# Patient Record
Sex: Female | Born: 1975 | Race: White | Hispanic: No | Marital: Married | Smoking: Never smoker
Health system: Southern US, Community
[De-identification: ages and names within clinical notes are randomized; demographics above are authoritative.]

## PROBLEM LIST (undated history)

## (undated) HISTORY — PX: MYRINGOTOMY WITH TUBE PLACEMENT: SHX5663

## (undated) HISTORY — PX: CHOLECYSTECTOMY: SHX55

---

## 2009-08-23 ENCOUNTER — Encounter: Admission: RE | Admit: 2009-08-23 | Discharge: 2009-08-23 | Payer: Self-pay | Admitting: Family Medicine

## 2010-10-29 IMAGING — CR DG ANKLE COMPLETE 3+V*R*
3 series · 3 of 3 positions shown · non-contrast
Comparison: None.

CLINICAL DATA: Fell last week with pain laterally

RIGHT ANKLE - COMPLETE 3+ VIEW

[view not recorded (1 of 3)]
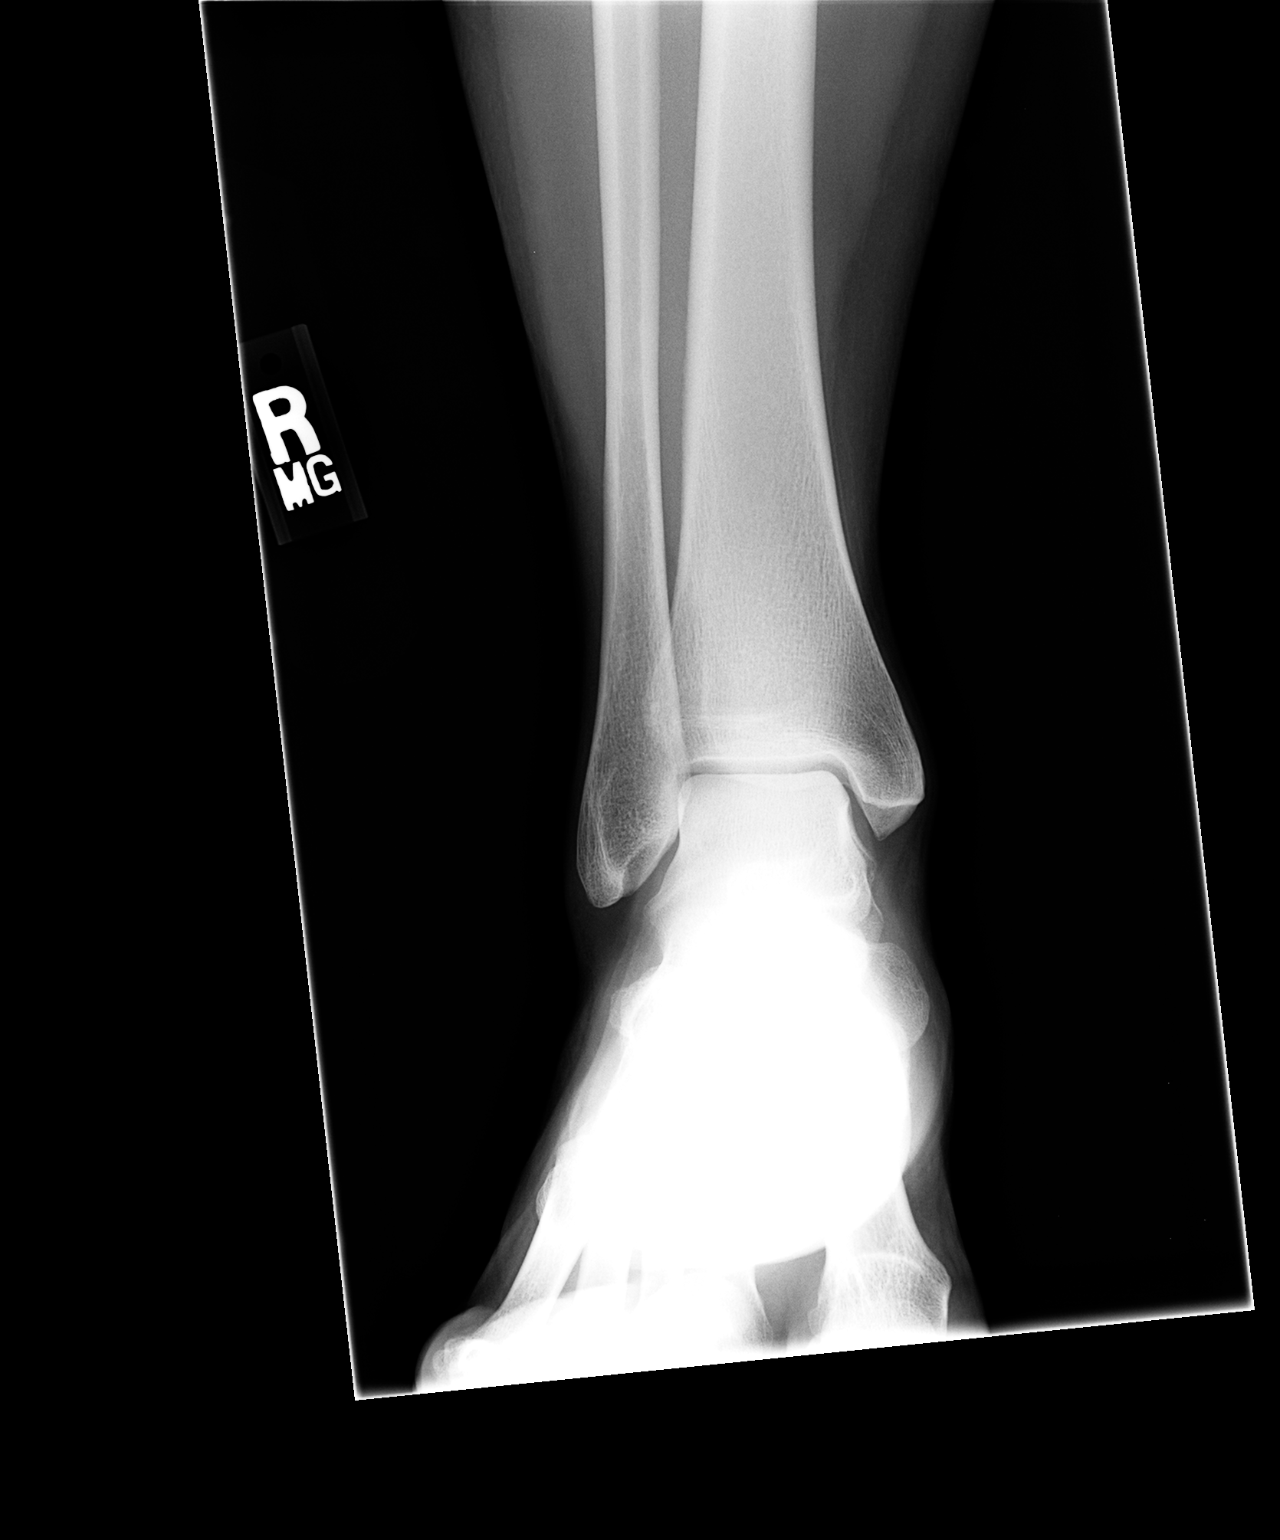

[view not recorded (2 of 3)]
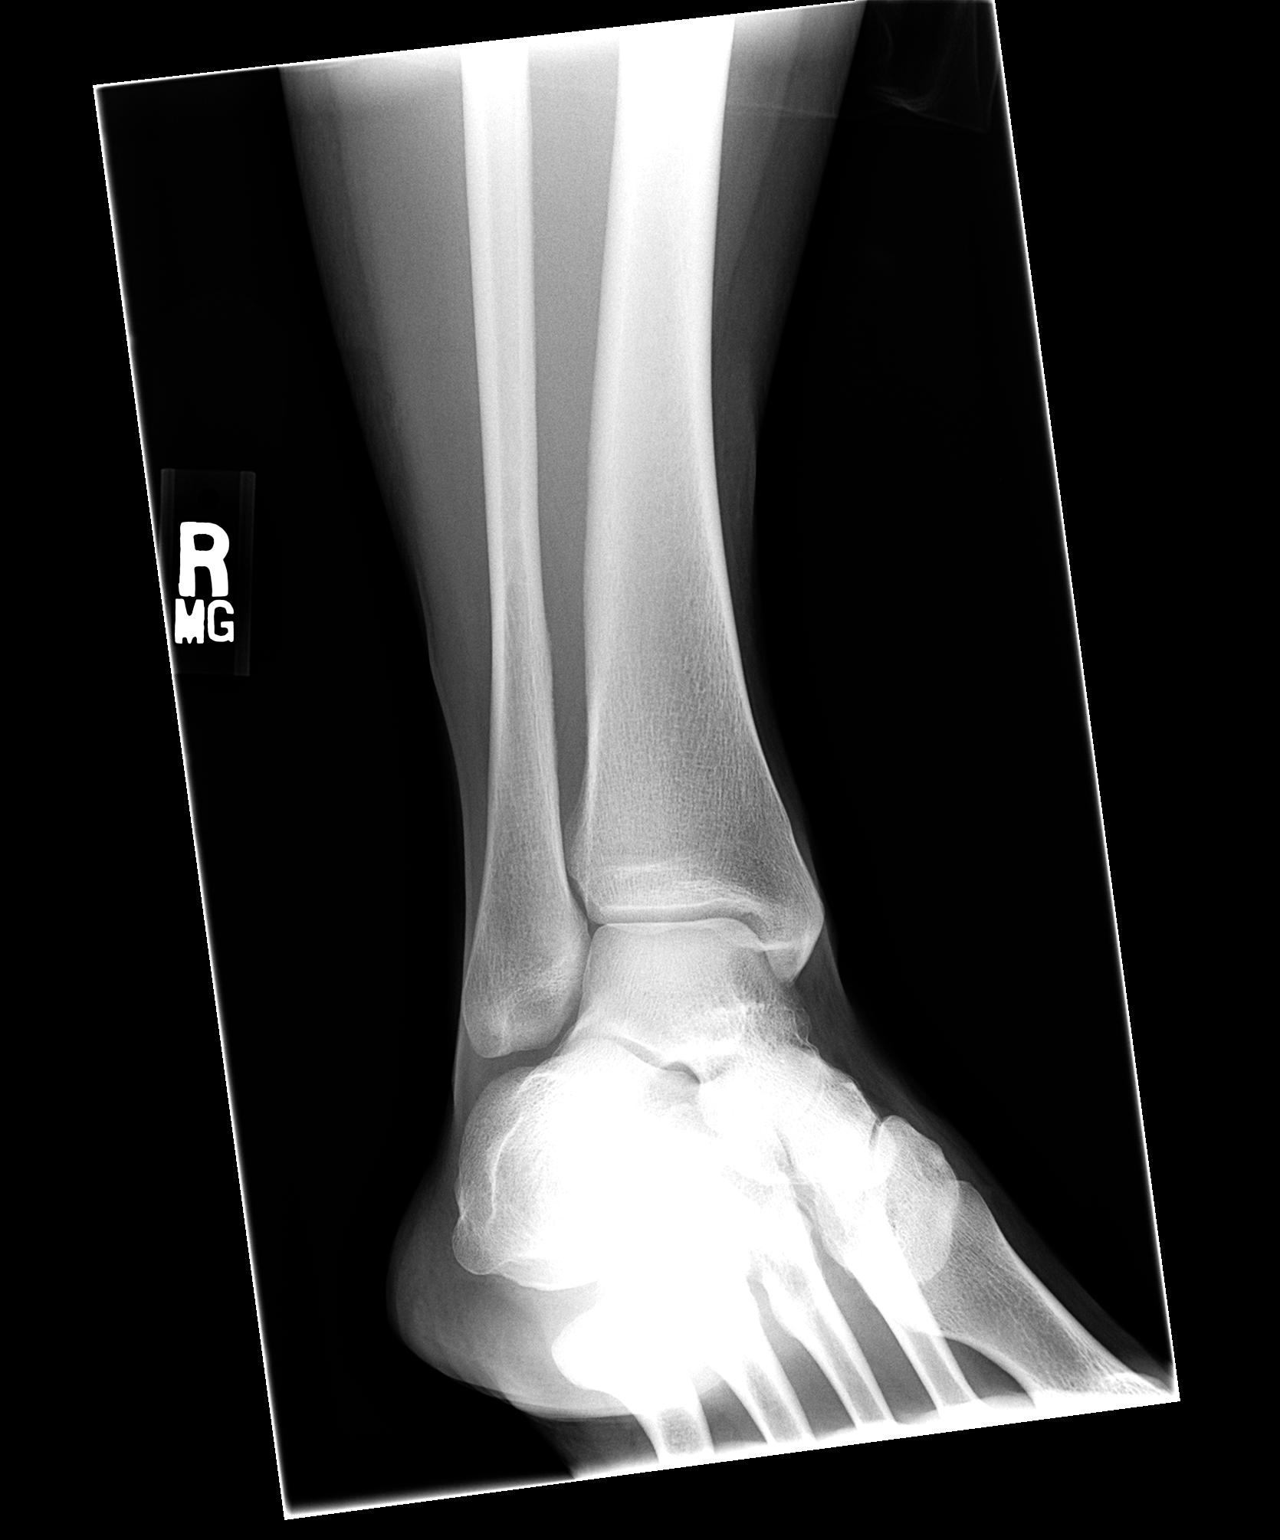

[view not recorded (3 of 3)]
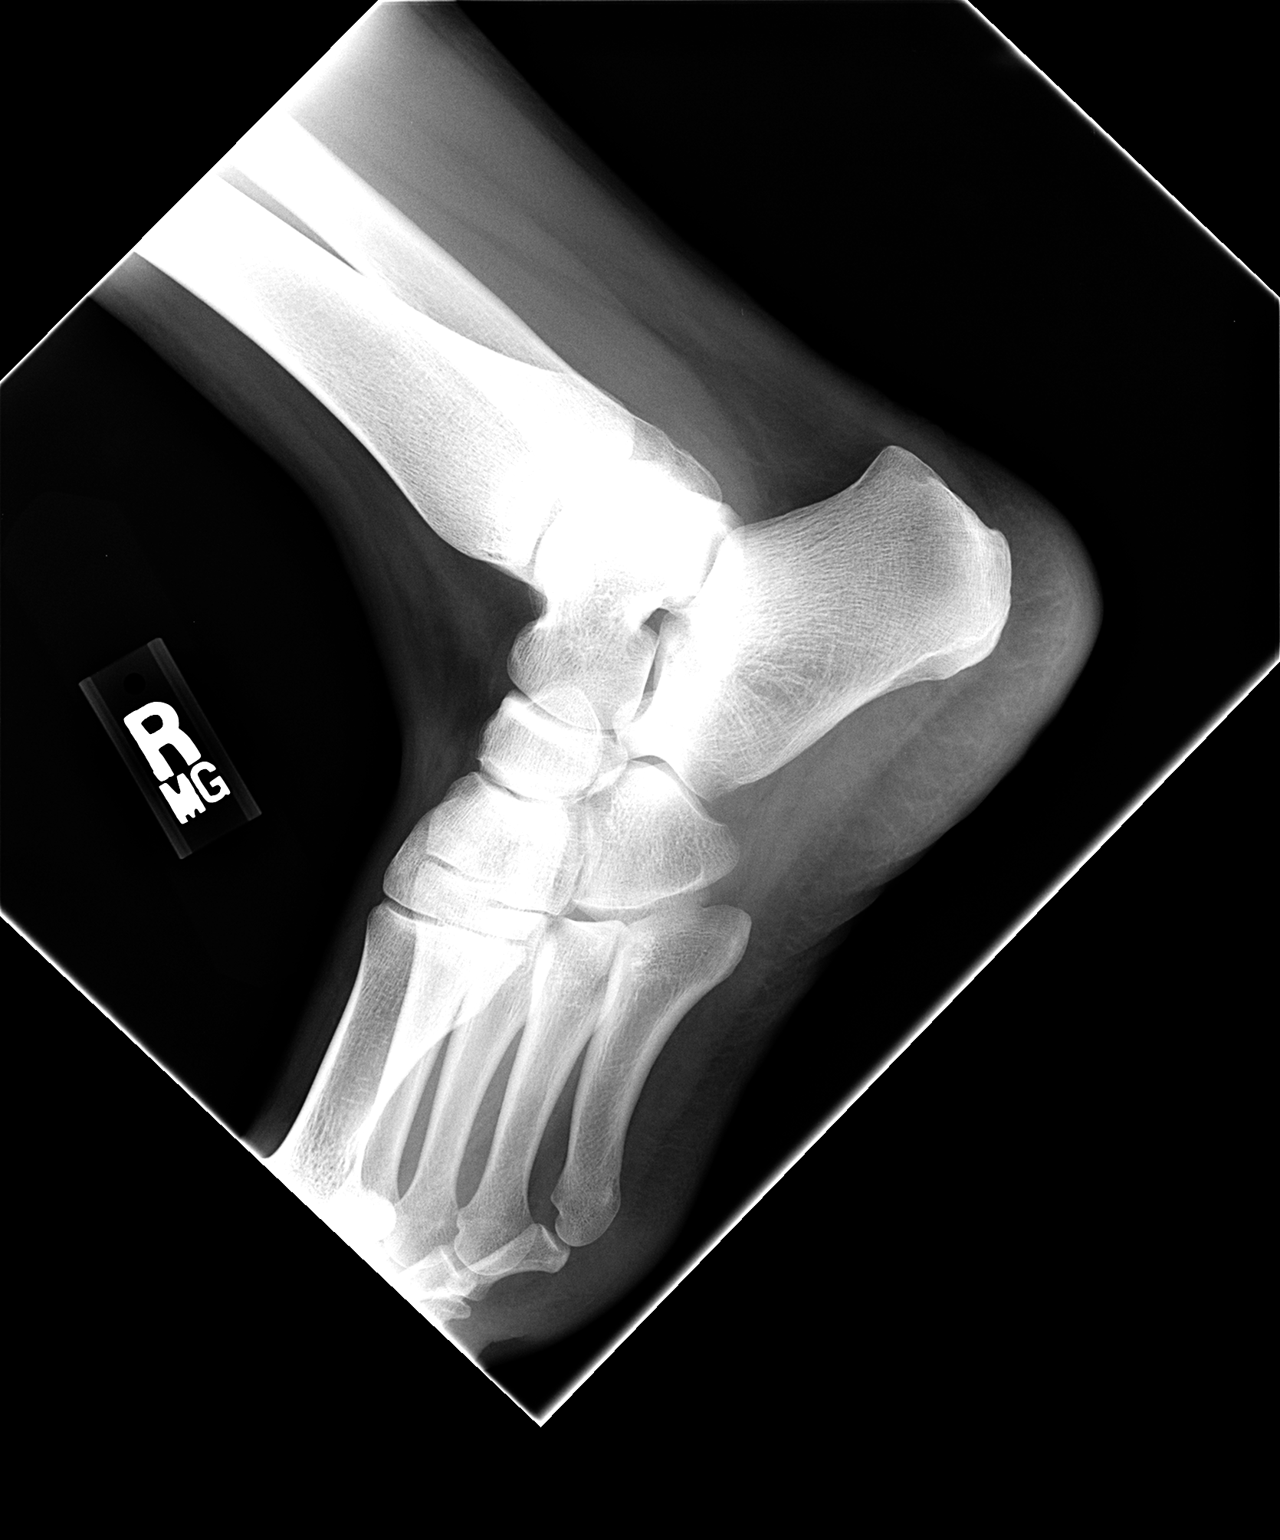

[3 of 3 positions shown; findings below may reference images not displayed]

FINDINGS: The ankle joint appears normal.  No acute fracture is
seen.  Alignment is normal.
IMPRESSION: Negative right ankle.

## 2014-11-09 ENCOUNTER — Emergency Department (HOSPITAL_COMMUNITY)
Admission: EM | Admit: 2014-11-09 | Discharge: 2014-11-09 | Disposition: A | Discharge status: Home / Self Care | Payer: BLUE CROSS/BLUE SHIELD | Attending: Emergency Medicine | Admitting: Emergency Medicine

## 2014-11-09 ENCOUNTER — Encounter (HOSPITAL_COMMUNITY): Payer: Self-pay | Admitting: Emergency Medicine

## 2014-11-09 ENCOUNTER — Emergency Department (HOSPITAL_COMMUNITY): Payer: BLUE CROSS/BLUE SHIELD

## 2014-11-09 DIAGNOSIS — Y9389 Activity, other specified: Secondary | ICD-10-CM | POA: Insufficient documentation

## 2014-11-09 DIAGNOSIS — Y998 Other external cause status: Secondary | ICD-10-CM | POA: Insufficient documentation

## 2014-11-09 DIAGNOSIS — Y9289 Other specified places as the place of occurrence of the external cause: Secondary | ICD-10-CM | POA: Insufficient documentation

## 2014-11-09 DIAGNOSIS — S92352A Displaced fracture of fifth metatarsal bone, left foot, initial encounter for closed fracture: Secondary | ICD-10-CM | POA: Diagnosis not present

## 2014-11-09 DIAGNOSIS — S92302A Fracture of unspecified metatarsal bone(s), left foot, initial encounter for closed fracture: Secondary | ICD-10-CM

## 2014-11-09 DIAGNOSIS — W1843XA Slipping, tripping and stumbling without falling due to stepping from one level to another, initial encounter: Secondary | ICD-10-CM | POA: Insufficient documentation

## 2014-11-09 DIAGNOSIS — S99922A Unspecified injury of left foot, initial encounter: Secondary | ICD-10-CM | POA: Diagnosis present

## 2014-11-09 MED ORDER — HYDROCODONE-ACETAMINOPHEN 5-325 MG PO TABS: 1.0000 | ORAL_TABLET | Freq: Four times a day (QID) | ORAL | Status: AC | PRN

## 2014-11-09 MED ORDER — NAPROXEN 500 MG PO TABS: 500.0000 mg | ORAL_TABLET | Freq: Two times a day (BID) | ORAL | Status: AC

## 2014-11-09 NOTE — ED Notes (Signed)
Pt stable, ambulatory, states understanding of discharge instructions 

## 2014-11-09 NOTE — ED Notes (Signed)
Ortho bedside

## 2014-11-09 NOTE — Progress Notes (Signed)
Orthopedic Tech Progress Note Patient Details:  Joanna Hoover 11/12/1975 [id number]  Ortho Devices Type of Ortho Device: Ace wrap, Crutches, Post (short leg) splint Ortho Device/Splint Location: LLE Ortho Device/Splint Interventions: Ordered, Application   Jennye Moccasin 11/09/2014, 10:46 PM

## 2014-11-09 NOTE — ED Provider Notes (Signed)
CSN: [id number]     Arrival date & time 11/09/14  2108 History  This chart was scribed for non-physician practitioner Jaynie Crumble, PA-C, working with Benjiman Core, MD, by Tanda Rockers, ED Scribe. This patient was seen in room TR10C/TR10C and the patient's care was started at 9:33 PM.  Chief Complaint  Patient presents with  . Foot Injury   The history is provided by the patient. No language interpreter was used.     HPI Comments: Joanna Hoover is a 39 y.o. female who presents to the Emergency Department complaining of sudden onset, constant, 8/10, left foot pain and swelling that occurred earlier tonight. Pt states that she missed a step, causing her to twist her ankle. Pt is able to ambulate but notes increased pain while bearing weight. She denies numbness, weakness, tingling, or any other associated symptoms.     History reviewed. No pertinent past medical history. Past Surgical History  Procedure Laterality Date  . Cholecystectomy    . Myringotomy with tube placement    . Cesarean section     No family history on file. Social History  Substance Use Topics  . Smoking status: Never Smoker   . Smokeless tobacco: None  . Alcohol Use: Yes   OB History    No data available     Review of Systems  Musculoskeletal: Positive for arthralgias (Left foot pain). Negative for gait problem.  Skin: Negative for wound.  Neurological: Negative for weakness and numbness.   Allergies  Review of patient's allergies indicates no known allergies.  Home Medications   Prior to Admission medications   Not on File   Triage Vitals: BP 162/102 mmHg  Pulse 87  Temp(Src) 98.9 F (37.2 C) (Oral)  Resp 16  Ht  (1.778 m)  Wt 190 lb (86.183 kg)  BMI 27.26 kg/m2  SpO2 98%  LMP 11/02/2014   Physical Exam  Constitutional: She is oriented to person, place, and time. She appears well-developed and well-nourished. No distress.  HENT:  Head: Normocephalic and atraumatic.   Eyes: Conjunctivae and EOM are normal.  Neck: Neck supple. No tracheal deviation present.  Cardiovascular: Normal rate.   Pulmonary/Chest: Effort normal. No respiratory distress.  Musculoskeletal: Normal range of motion.  Swelling noted to the dorsal lateral foot, mainly over the fifth and fourth metatarsals. Tender to palpation over fourth and fifth metatarsals. Normal toes, normal ankle with no tenderness to palpation of the bony joints. Full range of motion of the ankle and all toes. Dorsal pedal pulses intact.  Neurological: She is alert and oriented to person, place, and time.  Skin: Skin is warm and dry.  Psychiatric: She has a normal mood and affect. Her behavior is normal.  Nursing note and vitals reviewed.   ED Course  Procedures (including critical care time)  DIAGNOSTIC STUDIES: Oxygen Saturation is 98% on RA, normal by my interpretation.    COORDINATION OF CARE: 9:36 PM-Discussed treatment plan which includes DG L Foot with pt at bedside and pt agreed to plan.   Labs Review Labs Reviewed - No data to display  Imaging Review Dg Foot Complete Left  11/09/2014   CLINICAL DATA:  Left foot pain and swelling after injury. Missed a step and twisted left foot today, now with left foot pain. Initial encounter.  EXAM: LEFT FOOT - COMPLETE 3+ VIEW  COMPARISON:  None.  FINDINGS: Comminuted nondisplaced fracture base of the fifth metatarsal. There is no definite intra-articular extension. Associated soft tissue edema. No additional  fracture of the foot. The alignment is maintained.  IMPRESSION: Comminuted nondisplaced base of the fifth metatarsal fracture.   Electronically Signed   By: Rubye Oaks M.D.   On: 11/09/2014 22:20   I have personally reviewed and evaluated these images and lab results as part of my medical decision-making.   EKG Interpretation None      MDM   Final diagnoses:  Fracture of 5th metatarsal, left, closed, initial encounter   Pt with twisting  injury to the left foot. Reports of swelling, pain, unable to bear weight. Will get x-ray.  10:32 PM Comminuted nondisplaced fracture to the fifth metatarsal. We'll place in a splint, crutches, pain medications at home. Follow-up with orthopedic specialist for recheck next week. Nonweightbearing until follow-up.  Filed Vitals:   11/09/14 2118  BP: 162/102  Pulse: 87  Temp: 98.9 F (37.2 C)  TempSrc: Oral  Resp: 16  Height: 5\' 10"  (1.778 m)  Weight: 190 lb (86.183 kg)  SpO2: 98%     I personally performed the services described in this documentation, which was scribed in my presence. The recorded information has been reviewed and is accurate.    Jaynie Crumble, PA-C 11/09/14 2256  Benjiman Core, MD 11/10/14 [phone]

## 2014-11-09 NOTE — ED Notes (Signed)
Pt. missed her step and injured her left foot this evening , presents with pain and swelling .

## 2014-11-09 NOTE — Discharge Instructions (Signed)
Take naproxen for pain, take Norco for severe pain, keeping leg elevated, ice several times a day. Follow-up with orthopedic specialist next week.  Metatarsal Fracture, Undisplaced A metatarsal fracture is a break in the bone(s) of the foot. These are the bones of the foot that connect your toes to the bones of the ankle. DIAGNOSIS  The diagnoses of these fractures are usually made with X-rays. If there are problems in the forefoot and x-rays are normal a later bone scan will usually make the diagnosis.  TREATMENT AND HOME CARE INSTRUCTIONS  Treatment may or may not include a cast or walking shoe. When casts are needed the use is usually for short periods of time so as not to slow down healing with muscle wasting (atrophy).  Activities should be stopped until further advised by your caregiver.  Wear shoes with adequate shock absorbing capabilities and stiff soles.  Alternative exercise may be undertaken while waiting for healing. These may include bicycling and swimming, or as your caregiver suggests.  It is important to keep all follow-up visits or specialty referrals. The failure to keep these appointments could result in improper bone healing and chronic pain or disability.  Warning: Do not drive a car or operate a motor vehicle until your caregiver specifically tells you it is safe to do so. IF YOU DO NOT HAVE A CAST OR SPLINT:  You may walk on your injured foot as tolerated or advised.  Do not put any weight on your injured foot for as long as directed by your caregiver. Slowly increase the amount of time you walk on the foot as the pain allows or as advised.  Use crutches until you can bear weight without pain. A gradual increase in weight bearing may help.  Apply ice to the injury for 15-20 minutes each hour while awake for the first 2 days. Put the ice in a plastic bag and place a towel between the bag of ice and your skin.  Only take over-the-counter or prescription medicines  for pain, discomfort, or fever as directed by your caregiver. SEEK IMMEDIATE MEDICAL CARE IF:   Your cast gets damaged or breaks.  You have continued severe pain or more swelling than you did before the cast was put on, or the pain is not controlled with medications.  Your skin or nails below the injury turn blue or grey, or feel cold or numb.  There is a bad smell, or new stains or pus-like (purulent) drainage coming from the cast. MAKE SURE YOU:   Understand these instructions.  Will watch your condition.  Will get help right away if you are not doing well or get worse. Document Released: 11/02/2001 Document Revised: 05/05/2011 Document Reviewed: 09/24/2007 [facility] Patient Information 2015 [place], [place]. This information is not intended to replace advice given to you by your health care provider. Make sure you discuss any questions you have with your health care provider.

## 2016-01-15 IMAGING — CR DG FOOT COMPLETE 3+V*L*
3 series · 3 of 3 positions shown · non-contrast
Comparison: None.

CLINICAL DATA: Left foot pain and swelling after injury. Missed a
step and twisted left foot today, now with left foot pain. Initial
encounter.

EXAM:
LEFT FOOT - COMPLETE 3+ VIEW

[foot ap]
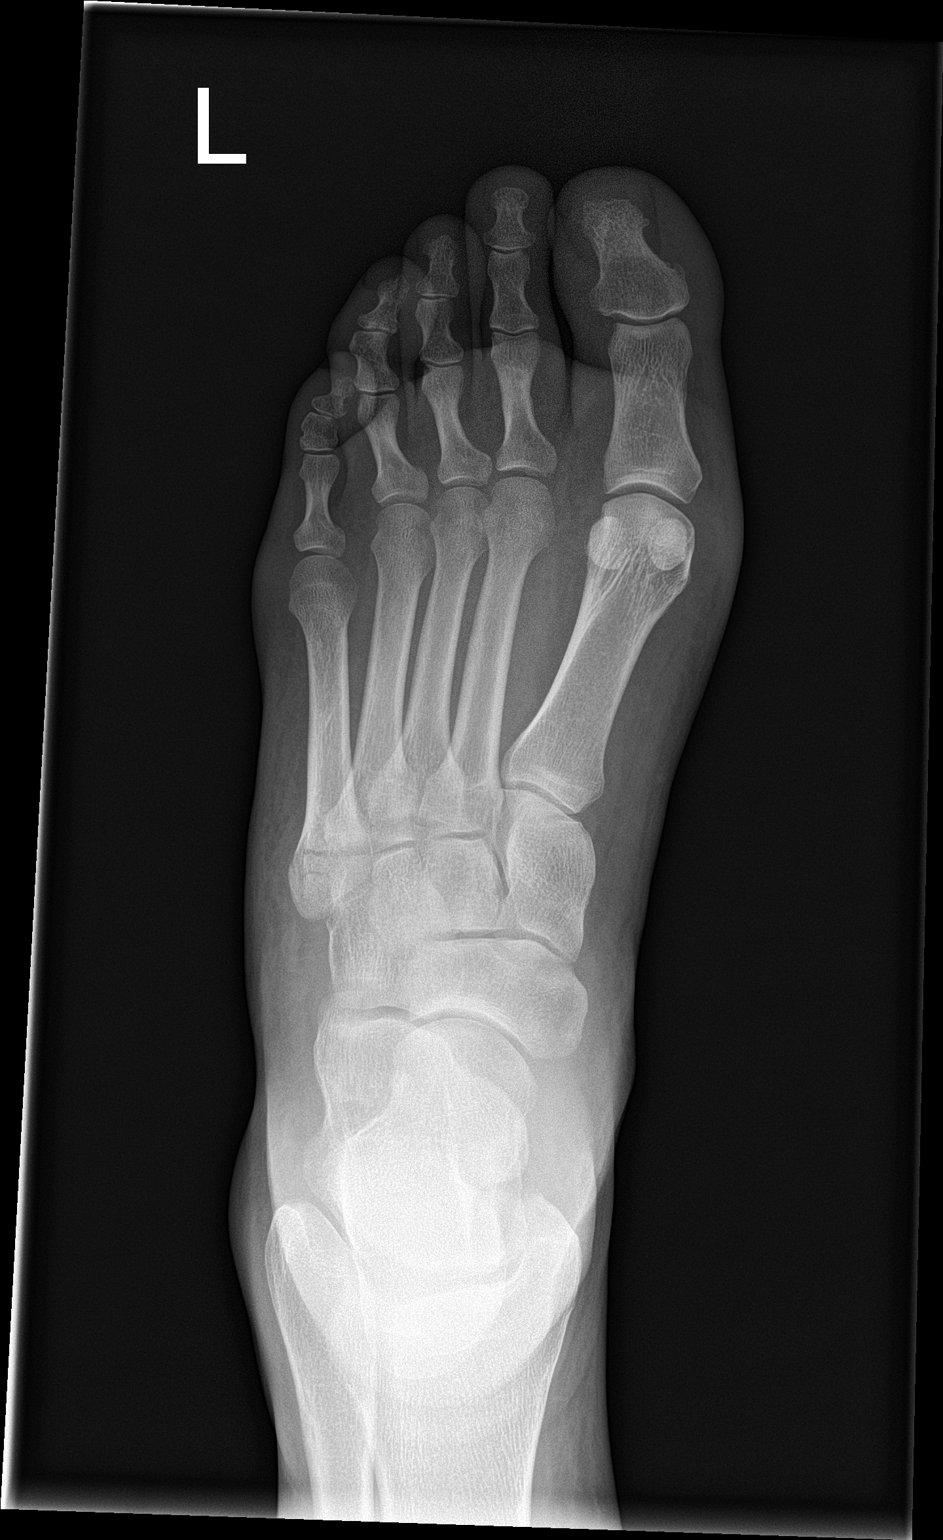

[foot obl]
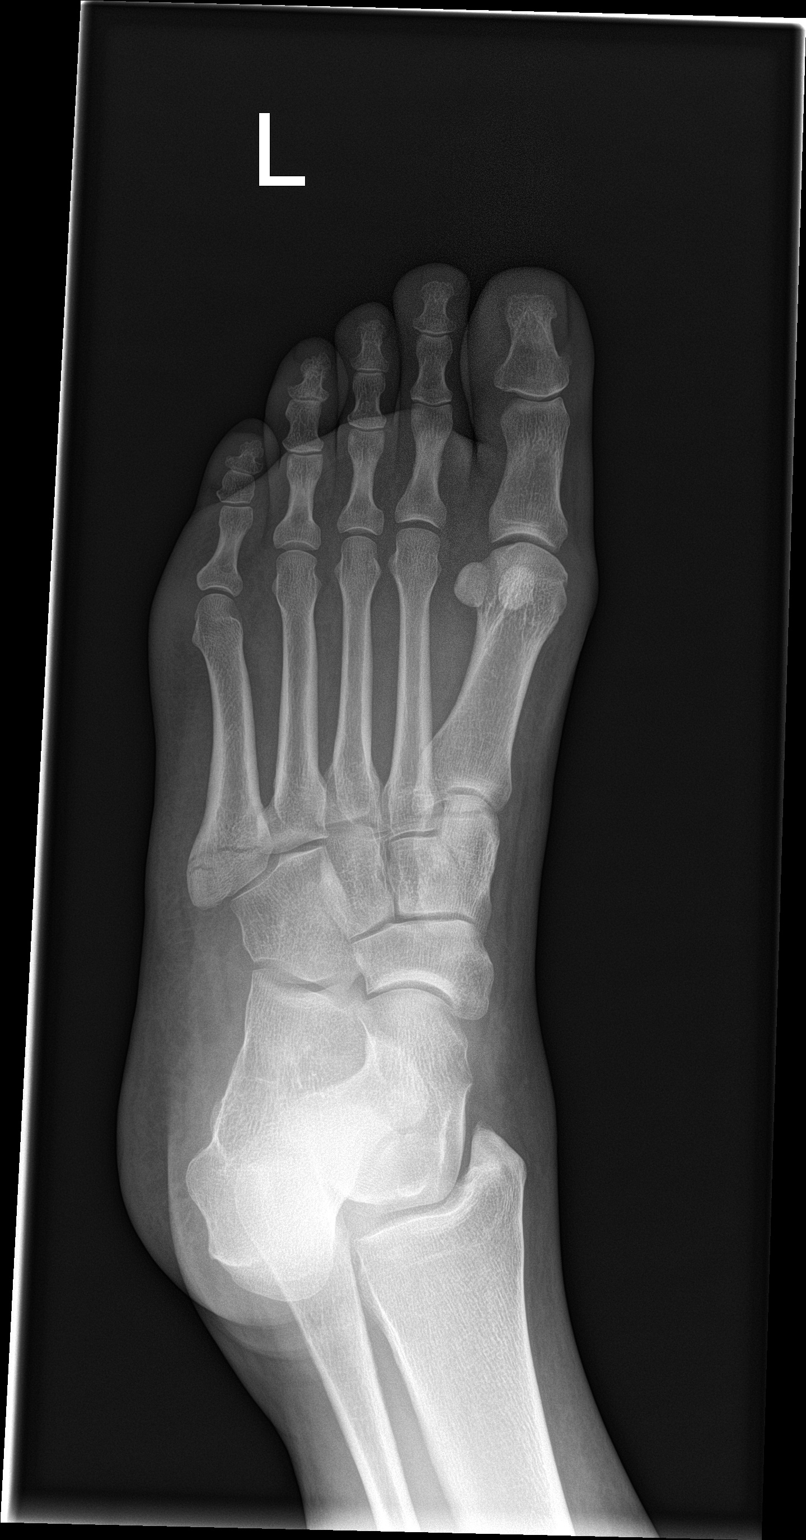

[foot lat]
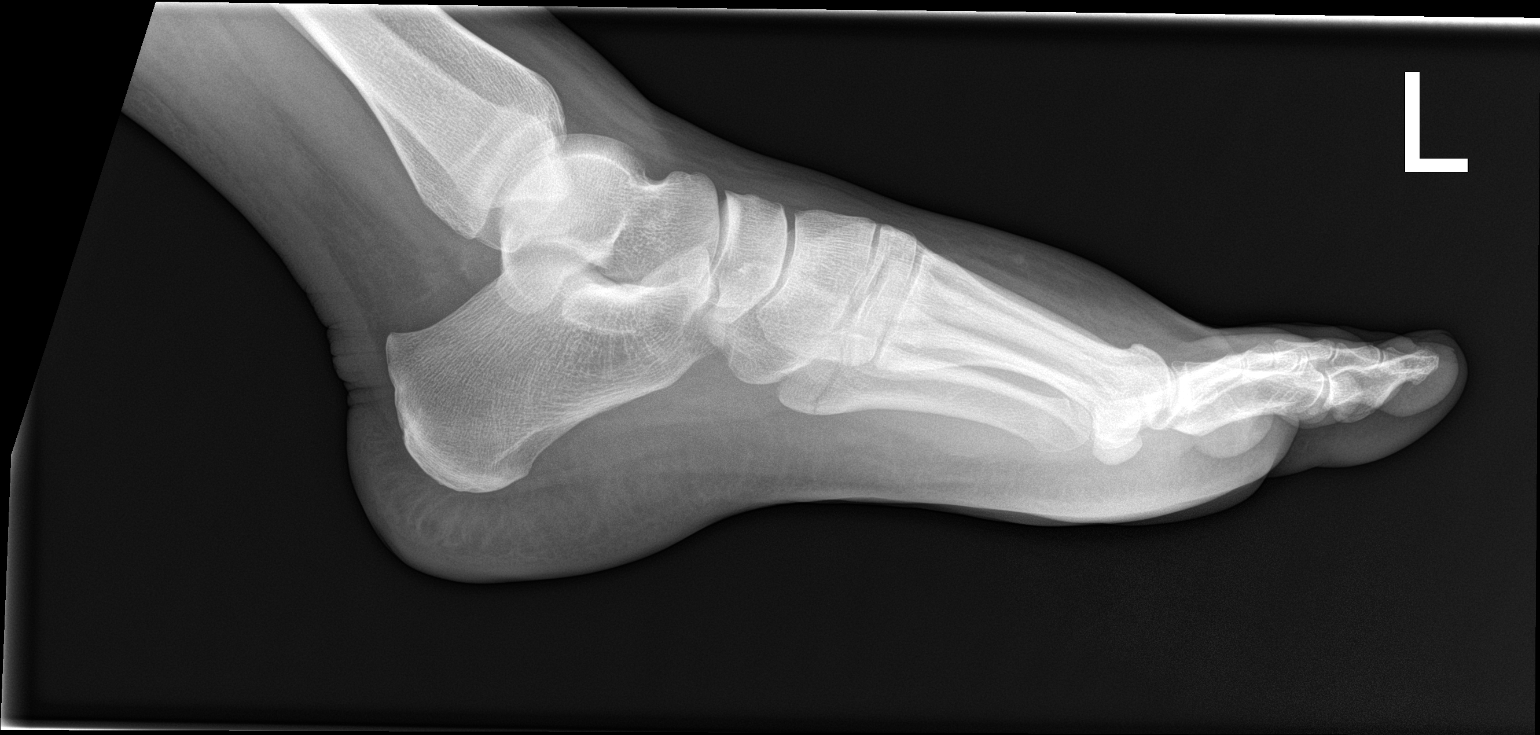

[3 of 3 positions shown; findings below may reference images not displayed]

FINDINGS: Comminuted nondisplaced fracture base of the fifth metatarsal. There
is no definite intra-articular extension. Associated soft tissue
edema. No additional fracture of the foot. The alignment is
maintained.
IMPRESSION: Comminuted nondisplaced base of the fifth metatarsal fracture.
